# Patient Record
Sex: Female | Born: 1935 | Race: White | Hispanic: No
Health system: Southern US, Community
[De-identification: ages and names within clinical notes are randomized; demographics above are authoritative.]

---

## 2000-02-17 ENCOUNTER — Encounter: Payer: Self-pay | Admitting: *Deleted

## 2000-02-17 ENCOUNTER — Encounter: Admission: RE | Admit: 2000-02-17 | Discharge: 2000-02-17 | Payer: Self-pay | Admitting: *Deleted

## 2000-09-22 ENCOUNTER — Inpatient Hospital Stay (HOSPITAL_COMMUNITY): Admission: AD | Admit: 2000-09-22 | Discharge: 2000-09-29 | Payer: Self-pay | Admitting: Orthopedic Surgery

## 2000-09-24 ENCOUNTER — Encounter: Payer: Self-pay | Admitting: Orthopaedic Surgery

## 2000-09-25 ENCOUNTER — Encounter: Payer: Self-pay | Admitting: Orthopedic Surgery

## 2005-08-18 ENCOUNTER — Ambulatory Visit: Payer: Self-pay | Admitting: Orthopedic Surgery

## 2006-04-25 ENCOUNTER — Encounter: Admission: RE | Admit: 2006-04-25 | Discharge: 2006-04-25 | Payer: Self-pay | Admitting: Unknown Physician Specialty

## 2006-05-08 ENCOUNTER — Encounter: Admission: RE | Admit: 2006-05-08 | Discharge: 2006-05-08 | Payer: Self-pay | Admitting: Unknown Physician Specialty

## 2006-08-30 ENCOUNTER — Encounter: Payer: Self-pay | Admitting: Internal Medicine

## 2008-03-31 ENCOUNTER — Encounter: Payer: Self-pay | Admitting: Cardiology

## 2008-03-31 ENCOUNTER — Ambulatory Visit: Payer: Self-pay | Admitting: Cardiovascular Disease

## 2008-03-31 ENCOUNTER — Inpatient Hospital Stay (HOSPITAL_COMMUNITY): Admission: EM | Admit: 2008-03-31 | Discharge: 2008-04-04 | Payer: Self-pay | Admitting: Cardiology

## 2008-04-14 DIAGNOSIS — D649 Anemia, unspecified: Secondary | ICD-10-CM | POA: Insufficient documentation

## 2008-04-14 DIAGNOSIS — I10 Essential (primary) hypertension: Secondary | ICD-10-CM | POA: Insufficient documentation

## 2008-04-14 DIAGNOSIS — I2510 Atherosclerotic heart disease of native coronary artery without angina pectoris: Secondary | ICD-10-CM | POA: Insufficient documentation

## 2008-04-14 DIAGNOSIS — I252 Old myocardial infarction: Secondary | ICD-10-CM | POA: Insufficient documentation

## 2008-04-14 DIAGNOSIS — M1990 Unspecified osteoarthritis, unspecified site: Secondary | ICD-10-CM | POA: Insufficient documentation

## 2008-04-16 ENCOUNTER — Encounter: Payer: Self-pay | Admitting: Physician Assistant

## 2008-04-16 ENCOUNTER — Ambulatory Visit: Payer: Self-pay | Admitting: Internal Medicine

## 2008-05-15 ENCOUNTER — Telehealth: Payer: Self-pay | Admitting: Cardiology

## 2008-06-02 ENCOUNTER — Telehealth: Payer: Self-pay | Admitting: Cardiology

## 2008-06-13 ENCOUNTER — Ambulatory Visit: Payer: Self-pay | Admitting: Cardiology

## 2008-06-13 DIAGNOSIS — E7800 Pure hypercholesterolemia, unspecified: Secondary | ICD-10-CM

## 2008-06-13 DIAGNOSIS — R0989 Other specified symptoms and signs involving the circulatory and respiratory systems: Secondary | ICD-10-CM

## 2008-06-13 DIAGNOSIS — I739 Peripheral vascular disease, unspecified: Secondary | ICD-10-CM

## 2008-06-16 LAB — CONVERTED CEMR LAB
ALT: 10 units/L (ref 0–35)
Alkaline Phosphatase: 50 units/L (ref 39–117)
BUN: 22 mg/dL (ref 6–23)
Basophils Absolute: 0 10*3/uL (ref 0.0–0.1)
Basophils Relative: 0.2 % (ref 0.0–3.0)
Bilirubin, Direct: 0.1 mg/dL (ref 0.0–0.3)
Cholesterol: 87 mg/dL (ref 0–200)
Creatinine, Ser: 1.7 mg/dL — ABNORMAL HIGH (ref 0.4–1.2)
Eosinophils Absolute: 0.1 10*3/uL (ref 0.0–0.7)
GFR calc non Af Amer: 30.18 mL/min (ref >60)
Glucose, Bld: 106 mg/dL — ABNORMAL HIGH (ref 70–99)
Hemoglobin: 9.7 g/dL — ABNORMAL LOW (ref 12.0–15.0)
Lymphocytes Relative: 17.2 % (ref 12.0–46.0)
MCHC: 33.4 g/dL (ref 30.0–36.0)
MCV: 90.8 fL (ref 78.0–100.0)
Monocytes Absolute: 0.4 10*3/uL (ref 0.1–1.0)
Neutro Abs: 3.1 10*3/uL (ref 1.4–7.7)
Neutrophils Relative %: 71.8 % (ref 43.0–77.0)
RDW: 14.4 % (ref 11.5–14.6)
Total Bilirubin: 0.6 mg/dL (ref 0.3–1.2)
Total Protein: 6.2 g/dL (ref 6.0–8.3)

## 2008-06-25 ENCOUNTER — Ambulatory Visit: Payer: Self-pay | Admitting: Cardiology

## 2008-09-17 ENCOUNTER — Ambulatory Visit: Payer: Self-pay | Admitting: Cardiology

## 2008-09-26 ENCOUNTER — Inpatient Hospital Stay: Admission: AD | Admit: 2008-09-26 | Discharge: 2008-10-11 | Payer: Self-pay | Admitting: Internal Medicine

## 2008-10-11 ENCOUNTER — Ambulatory Visit: Payer: Self-pay | Admitting: Cardiology

## 2008-10-11 ENCOUNTER — Inpatient Hospital Stay (HOSPITAL_COMMUNITY): Admission: EM | Admit: 2008-10-11 | Discharge: 2008-10-17 | Payer: Self-pay | Admitting: Emergency Medicine

## 2008-10-13 ENCOUNTER — Encounter (INDEPENDENT_AMBULATORY_CARE_PROVIDER_SITE_OTHER): Payer: Self-pay | Admitting: Internal Medicine

## 2008-10-13 ENCOUNTER — Encounter: Payer: Self-pay | Admitting: Cardiology

## 2008-11-24 DEATH — deceased

## 2010-02-14 ENCOUNTER — Encounter: Payer: Self-pay | Admitting: Unknown Physician Specialty

## 2010-04-30 LAB — BASIC METABOLIC PANEL
BUN: 39 mg/dL — ABNORMAL HIGH (ref 6–23)
CO2: 31 mEq/L (ref 19–32)
CO2: 32 mEq/L (ref 19–32)
Calcium: 8.3 mg/dL — ABNORMAL LOW (ref 8.4–10.5)
Calcium: 8.5 mg/dL (ref 8.4–10.5)
Chloride: 101 mEq/L (ref 96–112)
Chloride: 96 mEq/L (ref 96–112)
Creatinine, Ser: 1.18 mg/dL (ref 0.4–1.2)
Creatinine, Ser: 1.38 mg/dL — ABNORMAL HIGH (ref 0.4–1.2)
GFR calc Af Amer: 29 mL/min — ABNORMAL LOW (ref >60)
GFR calc Af Amer: 44 mL/min — ABNORMAL LOW (ref >60)
Potassium: 4.1 mEq/L (ref 3.5–5.1)
Sodium: 136 mEq/L (ref 135–145)
Sodium: 136 mEq/L (ref 135–145)

## 2010-04-30 LAB — POCT CARDIAC MARKERS
CKMB, poc: 2.8 ng/mL (ref 1.0–8.0)
Troponin i, poc: <0.05 ng/mL (ref 0.00–0.09)

## 2010-04-30 LAB — DIFFERENTIAL
Basophils Absolute: 0 10*3/uL (ref 0.0–0.1)
Basophils Absolute: 0 10*3/uL (ref 0.0–0.1)
Basophils Relative: 0 % (ref 0–1)
Basophils Relative: 0 % (ref 0–1)
Basophils Relative: 0 % (ref 0–1)
Basophils Relative: 0 % (ref 0–1)
Eosinophils Absolute: 0 10*3/uL (ref 0.0–0.7)
Eosinophils Absolute: 0.1 10*3/uL (ref 0.0–0.7)
Eosinophils Absolute: 0.3 10*3/uL (ref 0.0–0.7)
Eosinophils Relative: 0 % (ref 0–5)
Eosinophils Relative: 2 % (ref 0–5)
Eosinophils Relative: 2 % (ref 0–5)
Eosinophils Relative: 3 % (ref 0–5)
Lymphocytes Relative: 4 % — ABNORMAL LOW (ref 12–46)
Lymphocytes Relative: 8 % — ABNORMAL LOW (ref 12–46)
Lymphs Abs: 0.4 10*3/uL — ABNORMAL LOW (ref 0.7–4.0)
Monocytes Absolute: 0 10*3/uL — ABNORMAL LOW (ref 0.1–1.0)
Monocytes Absolute: 0.3 10*3/uL (ref 0.1–1.0)
Monocytes Absolute: 0.5 10*3/uL (ref 0.1–1.0)
Monocytes Absolute: 0.5 10*3/uL (ref 0.1–1.0)
Monocytes Relative: 1 % — ABNORMAL LOW (ref 3–12)
Monocytes Relative: 5 % (ref 3–12)
Monocytes Relative: 5 % (ref 3–12)
Neutro Abs: 7.6 10*3/uL (ref 1.7–7.7)
Neutrophils Relative %: 86 % — ABNORMAL HIGH (ref 43–77)

## 2010-04-30 LAB — COMPREHENSIVE METABOLIC PANEL
ALT: 14 U/L (ref 0–35)
AST: 19 U/L (ref 0–37)
AST: 23 U/L (ref 0–37)
AST: 26 U/L (ref 0–37)
Albumin: 2.8 g/dL — ABNORMAL LOW (ref 3.5–5.2)
Albumin: 2.8 g/dL — ABNORMAL LOW (ref 3.5–5.2)
Albumin: 2.9 g/dL — ABNORMAL LOW (ref 3.5–5.2)
Alkaline Phosphatase: 69 U/L (ref 39–117)
Alkaline Phosphatase: 77 U/L (ref 39–117)
Calcium: 8.2 mg/dL — ABNORMAL LOW (ref 8.4–10.5)
Chloride: 96 mEq/L (ref 96–112)
Chloride: 99 mEq/L (ref 96–112)
Creatinine, Ser: 1.5 mg/dL — ABNORMAL HIGH (ref 0.4–1.2)
GFR calc Af Amer: 32 mL/min — ABNORMAL LOW (ref >60)
GFR calc Af Amer: 40 mL/min — ABNORMAL LOW (ref >60)
GFR calc Af Amer: 45 mL/min — ABNORMAL LOW (ref >60)
GFR calc non Af Amer: 33 mL/min — ABNORMAL LOW (ref >60)
Potassium: 4.3 mEq/L (ref 3.5–5.1)
Potassium: 5 mEq/L (ref 3.5–5.1)
Sodium: 137 mEq/L (ref 135–145)
Sodium: 138 mEq/L (ref 135–145)
Total Bilirubin: 0.6 mg/dL (ref 0.3–1.2)
Total Bilirubin: 1.2 mg/dL (ref 0.3–1.2)
Total Protein: 5.6 g/dL — ABNORMAL LOW (ref 6.0–8.3)
Total Protein: 5.8 g/dL — ABNORMAL LOW (ref 6.0–8.3)

## 2010-04-30 LAB — URINE MICROSCOPIC-ADD ON

## 2010-04-30 LAB — CARDIAC PANEL(CRET KIN+CKTOT+MB+TROPI)
CK, MB: 6.2 ng/mL — ABNORMAL HIGH (ref 0.3–4.0)
CK, MB: 7.6 ng/mL — ABNORMAL HIGH (ref 0.3–4.0)
Relative Index: 7.7 — ABNORMAL HIGH (ref 0.0–2.5)
Relative Index: INVALID (ref 0.0–2.5)
Relative Index: INVALID (ref 0.0–2.5)
Total CK: 85 U/L (ref 7–177)
Troponin I: 0.18 ng/mL — ABNORMAL HIGH (ref 0.00–0.06)
Troponin I: 0.21 ng/mL — ABNORMAL HIGH (ref 0.00–0.06)
Troponin I: 0.24 ng/mL — ABNORMAL HIGH (ref 0.00–0.06)

## 2010-04-30 LAB — URINALYSIS, ROUTINE W REFLEX MICROSCOPIC
Bilirubin Urine: NEGATIVE
Nitrite: POSITIVE — AB
Specific Gravity, Urine: 1.01 (ref 1.005–1.030)
Urobilinogen, UA: 0.2 mg/dL (ref 0.0–1.0)

## 2010-04-30 LAB — PROTIME-INR
INR: 2.7 — ABNORMAL HIGH (ref 0.00–1.49)
INR: 2.9 — ABNORMAL HIGH (ref 0.00–1.49)
INR: 4.5 — ABNORMAL HIGH (ref 0.00–1.49)
Prothrombin Time: 28.8 seconds — ABNORMAL HIGH (ref 11.6–15.2)
Prothrombin Time: 30.1 seconds — ABNORMAL HIGH (ref 11.6–15.2)
Prothrombin Time: 41.3 seconds — ABNORMAL HIGH (ref 11.6–15.2)

## 2010-04-30 LAB — URINE CULTURE

## 2010-04-30 LAB — CBC
HCT: 28.1 % — ABNORMAL LOW (ref 36.0–46.0)
Hemoglobin: 8.5 g/dL — ABNORMAL LOW (ref 12.0–15.0)
MCHC: 33.3 g/dL (ref 30.0–36.0)
MCHC: 33.6 g/dL (ref 30.0–36.0)
MCHC: 34 g/dL (ref 30.0–36.0)
MCV: 90 fL (ref 78.0–100.0)
MCV: 90.1 fL (ref 78.0–100.0)
MCV: 90.6 fL (ref 78.0–100.0)
Platelets: 262 10*3/uL (ref 150–400)
Platelets: 272 10*3/uL (ref 150–400)
Platelets: 295 10*3/uL (ref 150–400)
Platelets: 302 10*3/uL (ref 150–400)
RBC: 2.82 MIL/uL — ABNORMAL LOW (ref 3.87–5.11)
RBC: 3.12 MIL/uL — ABNORMAL LOW (ref 3.87–5.11)
RBC: 3.36 MIL/uL — ABNORMAL LOW (ref 3.87–5.11)
RDW: 14.4 % (ref 11.5–15.5)
RDW: 14.5 % (ref 11.5–15.5)
WBC: 4.4 10*3/uL (ref 4.0–10.5)
WBC: 8.7 10*3/uL (ref 4.0–10.5)
WBC: 8.8 10*3/uL (ref 4.0–10.5)

## 2010-04-30 LAB — CROSSMATCH

## 2010-04-30 LAB — BLOOD GAS, ARTERIAL
Acid-Base Excess: 1.3 mmol/L (ref 0.0–2.0)
Bicarbonate: 26 mEq/L — ABNORMAL HIGH (ref 20.0–24.0)
TCO2: 24.7 mmol/L (ref 0–100)
pCO2 arterial: 45.7 mmHg — ABNORMAL HIGH (ref 35.0–45.0)
pO2, Arterial: 92.2 mmHg (ref 80.0–100.0)

## 2010-04-30 LAB — MAGNESIUM: Magnesium: 1.7 mg/dL (ref 1.5–2.5)

## 2010-04-30 LAB — ABO/RH: ABO/RH(D): AB POS

## 2010-05-06 LAB — CBC
HCT: 29.7 % — ABNORMAL LOW (ref 36.0–46.0)
Hemoglobin: 10.1 g/dL — ABNORMAL LOW (ref 12.0–15.0)
MCHC: 34.3 g/dL (ref 30.0–36.0)
MCV: 88.8 fL (ref 78.0–100.0)
MCV: 88.8 fL (ref 78.0–100.0)
MCV: 90 fL (ref 78.0–100.0)
Platelets: 167 10*3/uL (ref 150–400)
Platelets: 182 10*3/uL (ref 150–400)
Platelets: 230 10*3/uL (ref 150–400)
RBC: 2.74 MIL/uL — ABNORMAL LOW (ref 3.87–5.11)
RDW: 14.3 % (ref 11.5–15.5)
WBC: 5.1 10*3/uL (ref 4.0–10.5)
WBC: 5.4 10*3/uL (ref 4.0–10.5)
WBC: 5.8 10*3/uL (ref 4.0–10.5)

## 2010-05-06 LAB — BASIC METABOLIC PANEL
BUN: 18 mg/dL (ref 6–23)
BUN: 29 mg/dL — ABNORMAL HIGH (ref 6–23)
Calcium: 8.3 mg/dL — ABNORMAL LOW (ref 8.4–10.5)
Chloride: 104 mEq/L (ref 96–112)
Chloride: 108 mEq/L (ref 96–112)
Creatinine, Ser: 1.16 mg/dL (ref 0.4–1.2)
Creatinine, Ser: 1.3 mg/dL — ABNORMAL HIGH (ref 0.4–1.2)
GFR calc Af Amer: 47 mL/min — ABNORMAL LOW (ref >60)
GFR calc non Af Amer: 44 mL/min — ABNORMAL LOW (ref >60)
Glucose, Bld: 96 mg/dL (ref 70–99)
Potassium: 4.1 mEq/L (ref 3.5–5.1)

## 2010-05-06 LAB — DIFFERENTIAL
Basophils Absolute: 0 10*3/uL (ref 0.0–0.1)
Basophils Relative: 0 % (ref 0–1)
Eosinophils Absolute: 0.1 10*3/uL (ref 0.0–0.7)
Monocytes Absolute: 0.4 10*3/uL (ref 0.1–1.0)
Neutro Abs: 4.3 10*3/uL (ref 1.7–7.7)

## 2010-05-06 LAB — CROSSMATCH: Antibody Screen: NEGATIVE

## 2010-05-06 LAB — APTT: aPTT: 95 seconds — ABNORMAL HIGH (ref 24–37)

## 2010-05-06 LAB — PROTIME-INR
INR: 1 (ref 0.00–1.49)
Prothrombin Time: 13.2 seconds (ref 11.6–15.2)

## 2010-05-06 LAB — COMPREHENSIVE METABOLIC PANEL
Albumin: 3.1 g/dL — ABNORMAL LOW (ref 3.5–5.2)
Alkaline Phosphatase: 71 U/L (ref 39–117)
BUN: 17 mg/dL (ref 6–23)
Chloride: 103 mEq/L (ref 96–112)
Glucose, Bld: 100 mg/dL — ABNORMAL HIGH (ref 70–99)
Potassium: 3.9 mEq/L (ref 3.5–5.1)
Total Bilirubin: 0.6 mg/dL (ref 0.3–1.2)

## 2010-05-06 LAB — CARDIAC PANEL(CRET KIN+CKTOT+MB+TROPI)
CK, MB: 4.5 ng/mL — ABNORMAL HIGH (ref 0.3–4.0)
Relative Index: INVALID (ref 0.0–2.5)
Relative Index: INVALID (ref 0.0–2.5)
Troponin I: 2.09 ng/mL (ref 0.00–0.06)

## 2010-05-06 LAB — HEPARIN LEVEL (UNFRACTIONATED)
Heparin Unfractionated: 0.14 IU/mL — ABNORMAL LOW (ref 0.30–0.70)
Heparin Unfractionated: 0.44 IU/mL (ref 0.30–0.70)
Heparin Unfractionated: 0.68 IU/mL (ref 0.30–0.70)
Heparin Unfractionated: 0.78 IU/mL — ABNORMAL HIGH (ref 0.30–0.70)

## 2010-05-06 LAB — LIPID PANEL
LDL Cholesterol: 76 mg/dL (ref 0–99)
Total CHOL/HDL Ratio: 2.8 RATIO
VLDL: 11 mg/dL (ref 0–40)

## 2010-05-06 LAB — ABO/RH: ABO/RH(D): AB POS

## 2010-05-06 LAB — MAGNESIUM: Magnesium: 1.9 mg/dL (ref 1.5–2.5)

## 2010-05-06 LAB — FERRITIN: Ferritin: 19 ng/mL (ref 10–291)

## 2010-05-06 LAB — FOLATE: Folate: >20 ng/mL

## 2010-05-06 LAB — VITAMIN B12: Vitamin B-12: 235 pg/mL (ref 211–911)

## 2010-05-06 LAB — HEMOGLOBIN A1C: Mean Plasma Glucose: 103 mg/dL

## 2010-06-08 NOTE — Cardiovascular Report (Signed)
NAMECATHERENE, [name]                ACCOUNT NO.:  [id number]   MEDICAL RECORD NO.:  [id number]          PATIENT TYPE:  INP   LOCATION:  2908                         FACILITY:  [facility]   PHYSICIAN:  Bevelyn Buckles. Bensimhon, MDDATE OF BIRTH:  1919-01-05   DATE OF PROCEDURE:  03/31/2008  DATE OF DISCHARGE:                            CARDIAC CATHETERIZATION   INDICATION:  Ms. [name] is a delightful 77 year old woman with a history  of hypertension, who was admitted with a non-ST elevation myocardial  infarction.  She was brought to the cardiac catheterization lab for  diagnostic angiography.   PROCEDURES PERFORMED:  1. Selective coronary angiography.  2. Left heart cath.  3. Left ventriculogram.  4. Abdominal aortogram.   DESCRIPTION OF PROCEDURE:  Risks and indication of the catheterization  were explained.  Consent was signed and placed on the chart.  Initially  we placed a 5-French arterial sheath in the right femoral artery.  However, we found that the distal aorta was totally occluded.  We thus  turned our attention to the right brachial site.  This was prepped and  draped in routine sterile fashion.  A 5-French arterial sheath was  placed without difficulty.  She was anticoagulated with systemic  heparin.   Standard catheters including JL-4, JR-4, and angled pigtail used for  procedure.  All catheter exchanges made over wire.  There are no  apparent complications.  Central aortic pressure is 135/56 with mean of  91.  LV pressure is 158/7 with EDP of 11.  There was no aortic stenosis.   Left main had some mild-to-moderate ostial disease.  It was hard to  quantify this, did not appear to be high-grade stenosis.   LAD was moderate-to-large size vessel.  It gave off a large diagonal  branch, a large septal perforator.  It was heavily calcified.  In the  proximal portion, there was a very complex calcified plaque  approximately 95% in severity after takeoff of the diagonal branch and  large septal perforator.  More distally there was a 40% tubular lesion  in the LAD.  In the proximal part of the septal perforator, there was a  70% stenosis.   Left circumflex was an extremely tortuous vessel.  It was heavily  calcified.  It gave off a small ramus branch and two marginal branches.  In the mid AV groove circ, there was 90-95% lesion followed by another  50% lesion.  Both were round tight bands.   Right coronary artery was a large dominant vessel.  It gave off a PDA  and several small posterolaterals.  There was heavily calcified plaque  in the proximal portion of the RCA, which had the appearance of a  ruptured plaque.  This was a 99% stenosis.  Through the proximal  midsection, it was heavily calcified about 40% throughout.  In the mid  distal RCA, there was 50-60% focal lesion.  There was a question of the  appearance of competitive flow in the distal PDA.   Left ventriculogram done by hand injection showed an EF what appeared to  be about 65% though the  ventricle was not well opacified.   Abdominal aorta was totally occluded just above the iliacs.   ASSESSMENT:  1. Severe three-vessel calcified coronary artery disease.  2. Apparently normal function.  3. Severe peripheral arterial disease with totally occluded distal      aorta.   PLAN/DISCUSSION:  This is a very difficult situation and I suspect the  culprit lesion is likely the proximal RCA.  The proximal LAD is also a  very tight.  Both lesions are not easily amenable to percutaneous  intervention.  Unfortunately she is also not a good candidate for bypass  surgery.  I have reviewed the films with Dr. Riley Kill and we will take  her off the table and we will review the situation with the remainder of  the interventional team as well as her family.      Bevelyn Buckles. Bensimhon, MD  Electronically Signed     DRB/MEDQ  D:  03/31/2008  T:  04/01/2008  Job:  [id number]

## 2010-06-08 NOTE — Discharge Summary (Signed)
[name], [name]                ACCOUNT NO.:  [id number]   MEDICAL RECORD NO.:  [id number]          PATIENT TYPE:  INP   LOCATION:  2908                         FACILITY:  [facility]   PHYSICIAN:  Noralyn Pick. Eden Emms, MD, FACCDATE OF BIRTH:  [birth year]   DATE OF ADMISSION:  03/31/2008  DATE OF DISCHARGE:  04/04/2008                               DISCHARGE SUMMARY   ADDENDUM   This is an addendum to the discharge summary completed on April 04, 2008, report number [id number], regarding followup appointments.  The patient  will have a complete metabolic panel and complete blood count completed  at the [name] in [place].  The patient will be instructed and given  a prescription for these tests to take place on of before April 14, 2008, and [name] will fax results to the [name].  The  patient will also be given the phone number to the [name] to call  with questions regarding setting up this lab draw.  The patient will  have a followup appointment with [name], in lieu of Dr.  Jens Howe, secondary to [name] not being available within the  preferred range of time for her first appointment.  The patient will be  seen on April 16, 2008, at 1:15 p.m.  Note, the patient has agreed that  driving down to [facility] will not be a problem in the future for her  Cardiology followup appointment.      Jarrett Ables, PAC      Heather C. Eden Emms, MD, [facility]  Electronically Signed    MS/MEDQ  D:  04/04/2008  T:  04/04/2008  Job:  [id number]

## 2010-06-08 NOTE — Discharge Summary (Signed)
NAMEJAYLINE, KILBURG                ACCOUNT NO.:  [id number]   MEDICAL RECORD NO.:  [id number]          PATIENT TYPE:  INP   LOCATION:  2908                         FACILITY:  [facility]   PHYSICIAN:  Noralyn Pick. Eden Emms, MD, FACCDATE OF BIRTH:  August 29, 1918   DATE OF ADMISSION:  03/31/2008  DATE OF DISCHARGE:  04/04/2008                               DISCHARGE SUMMARY   PRIMARY CARDIOLOGIST:  Madolyn Frieze. Jens Som, MD, [facility]   PRIMARY CARE PHYSICIAN:  Kirstie Peri, [place], [place], [place].   DISCHARGE DIAGNOSES:  1. Non-ST-elevation myocardial infarction.  2. Coronary artery disease (severe three-calcified coronary artery      disease).  3. Normocytic anemia.   SECONDARY DIAGNOSES:  1. Hypertension.  2. Osteoarthritis.   ALLERGIES:  NKDA.   PROCEDURE PERFORMED DURING THIS HOSPITALIZATION:  1. EKG performed on March 31, 2008, that showed in sinus rhythm with      premature atrial complexes, minimal T-wave flattening in V4, T-wave      flattening in V5, V6, and lead II, and then T-wave inversion in      lead III and aVF.  No significant Q-waves.  Normal axis.  No      evidence of hypertrophy.  No prior EKG for comparison.  The patient      had chest x-ray on March 31, 2008, that showed cardiomegaly without      evidence of pulmonary edema.  The patient had a 2-D echocardiogram      on March 31, 2008, that showed all LVEF approximately 50% with      akinesis of the basal inferior wall; hypokinesis of the mid      inferior and basal posterior walls.  There was mild mitral valvular      regurgitation.  There was mild to moderate pulmonic regurgitation.      The patient had cardiac catheterization on March 31, 2008, that      showed:      a.     Severe three-vessel calcified coronary artery disease.      b.     Apparently normal function.      c.     Severe peripheral arterial disease with totally occluded       distal aorta.   HISTORY OF PRESENT ILLNESS:  This is an 77 year old Caucasian  female  with known history of coronary artery disease.  She was awakened with  severe 10/10 chest pressure 3 mornings ago (March 28, 2008).  This lasted  several hours.  She had never experienced these symptoms before.  She  was extremely uncomfortable, short of breath, diaphoretic, finally  called 911.  By the time EMS arrived, she was feeling and so she  declined to go to the emergency room.  However, she then had  intermittent chest pressure over the next 2 days and finally on date of  admission had persistent chest pressure/pain across the scapula and the  shoulders and shortness of breath prompting her to present to the local  emergency department for evaluation.  There, she had an abnormal EKG and  her troponin was  greater than 1.  She was thus transferred to [facility].   HOSPITAL COURSE:  So, the patient was admitted to [facility]  and given her elevated cardiac enzymes as well as an abnormal  echocardiogram and EKG the patient underwent diagnostic cardiac  catheterization (see results above).  The patient tolerated the  procedure well without significant complications.  The patient remained  asymptomatic after her cardiac catheterization except for a brief period  of right groin tenderness, but resolved as of April 02, 2008.  Due to  the high risk of PCI with given the nature of her disease, all the  interventionists at [facility] discussed the plan moving forward  and on April 02, 2008, it was decided that the patient would be best  managed medically and high risk PCI only considering in the future  should her symptoms return/considerably worsen.  Note, when the patient  first evaluated at [facility], she had normocytic anemia just over 10  and post cath she dropped down to 8.3 prompting transfusion of 2 units  of packed red blood cells.  The patient's hemoglobin got back up to 10.2  and she was started on iron sulfate 325 mg p.o. b.i.d. The patient  was  deemed stable enough for discharge on April 04, 2008, after ambulating  day before without significant symptoms and remaining asymptomatic.  Her  vital signs remained stable. The patient had a brief run of premature  atrial complexes up to a rate of 126 ventricular responses per minute  lasting approximately 6 seconds on the morning of April 04, 2008, end of  run viewed on tele without flutter waves, right back into sinus.   MOST RECENT VITAL SIGNS ON DAY OF DISCHARGE:  Temperature 99.5 degrees  Fahrenheit, BP 129/44, heart rate 73 beats per minute, respiration rate  19, and O2 saturation 95% on room air.   LABORATORY DATA:  WBC 5.4, HGB 10.3, HCT 30.2, and PLT count 167.  Sodium 139, potassium 3.9, chloride 104, CO2 of 25, BUN 18, creatinine  1.16, glucose 96, and calcium 8.4.  Vitamin B12 of 235, folate serum  greater than 20.0, ferritin 19, and magnesium 1.9.  Hemoglobin A1c 5.2,  total cholesterol 135, triglycerides 54, HDL 48, LDL 76, and VLDL of 11.  TSH 3.002.  Total bilirubin 0.6, alkaline phosphatase 71, AST 26, ALT  11, total protein 5.9, and albumin 3.1.   FOLLOWUP PLANS AND APPOINTMENTS:  The patient lives in [place] and was  mistakenly scheduled to be seen in [place].  Please see addendum for  change to followup appointments with [facility].   DISCHARGE MEDICATIONS:  1. Labetalol 200 mg p.o. b.i.d.  2. Crestor 40 mg p.o. daily.  3. Hydrochlorothiazide 25 mg p.o. daily.  4. Norvasc 10 mg p.o. daily.  5. Protonix 40 mg p.o. daily.  6. Plavix 75 mg p.o. daily.  7. Aspirin 81 mg p.o. daily.  8. Ferrous sulfate 325 mg p.o. b.i.d.  9. Imdur 90 mg (3 times 30-mg tabs) p.o. daily.  10.Gabapentin 300 mg p.o. t.i.d.  11.Nitroglycerin 0.4 mg sublingually p.r.n. for chest pain.   Duration of discharge encounter including physician time was 45 minutes.      Jarrett Ables, PAC      Peter C. Eden Emms, MD, [facility]  Electronically Signed    MS/MEDQ  D:   04/04/2008  T:  04/04/2008  Job:  [id number]   cc:   Madolyn Frieze. Jens Som, MD, [facility]  Kirstie Peri,  MD

## 2010-06-08 NOTE — H&P (Signed)
NAMEMADIE, CAHN NO.:  [id number]   MEDICAL RECORD NO.:  [id number]          PATIENT TYPE:  INP   LOCATION:  2908                         FACILITY:  [facility]   PHYSICIAN:  Christell Faith, MD   DATE OF BIRTH:  Jun 30, 1918   DATE OF ADMISSION:  03/31/2008  DATE OF DISCHARGE:                              HISTORY & PHYSICAL   PRIMARY CARE PHYSICIAN:  Kirstie Peri, [place] in [place], [place].   CHIEF COMPLAINT:  Chest pain.   HISTORY OF PRESENT ILLNESS:  This is an 77 year old white female with no  known history of coronary artery disease.  She was awakened with severe  10/10 chest pressure 3 mornings ago (Friday morning).  This lasted  several hours.  She had never had pain like it before.  She felt  extremely uncomfortable, short of breath, diaphoretic, finally called  911.  However, by the time they arrived, she was feeling better and so  she declined to go to the emergency room.  However, she then had  intermittent chest pressure over the next 2 days and finally today had  persistent chest pressure pain across the scapula and shoulders and  shortness of breath and so went to the local emergency room where her  EKG was abnormal, and her troponin was greater than 1.  She was thus  transferred to the [facility] Coronary Care Unit.   PAST MEDICAL HISTORY:  1. Hypertension.  2. Osteoarthritis, status post bilateral total knee replacement.   SOCIAL HISTORY:  Lives in [place] with her son.  She is a widow.  Lifelong  nonsmoker, nondrinker.   FAMILY HISTORY:  Mother and father both deceased, neither had coronary  disease.  She lives with her son who has what sounds like metastatic  cancer and is undergoing treatment at [facility].   REVIEW OF SYSTEMS:  Positive for chest pain, shortness of breath, joint  pain over the past 2-3 days and also a pain across her back and scapula  for the past several weeks which has been diagnosed as arthritis.  The  balance of 14 systems is  reviewed and negative.   ALLERGIES:  None.   MEDICATIONS:  1. HCTZ 25 mg p.o. daily.  2. Norvasc 10 mg p.o. daily.  3. Hydralazine 50 mg p.o. t.i.d..  4. Aspirin 81 mg p.o. daily.  5. Vicodin q.6 h. as needed.  6. Neurontin 300 mg p.o. t.i.d.  7. Labetalol 200 mg p.o. b.i.d.   PHYSICAL EXAMINATION:  VITAL SIGNS:  Temperature 97.8, pulse 79,  respiratory rate 16, blood pressure 164/70, saturation 99% on 2 L.  GENERAL:  This is a pleasant elderly white female, in no distress.  HEENT:  Pupils are round and reactive.  Sclerae are clear.  Mucous  membranes are moist.  No oral lesions.  She has upper and lower  dentures.  NECK:  Supple.  Neck veins are flat.  No carotid bruits.  LUNGS:  Clear to auscultation bilaterally without wheezing or rales.  CARDIAC:  Normal rate, regular rhythm with frequent PACs and a split  second heart sound.  No murmurs.  ABDOMEN:  Soft, nontender, nondistended.  No bruits.  EXTREMITIES:  No clubbing, cyanosis, or edema.  2+ dorsalis pedis and  radial pulses bilaterally.  NEUROLOGIC:  5/5 strength in all 4 extremities.  Facial expressions are  symmetric and normal.   DIAGNOSTIC TESTS:  EKG shows sinus rhythm 80 beats a minute with  frequent PACs.  Positive for ischemia with inferolateral ST and T-wave  changes.   LABORATORY DATA:  White blood cells 6.7, hemoglobin 10, platelets 247.  Sodium 135, potassium 4, BUN 21, creatinine 1.2, glucose 131, CK 71, CK-  MB 4.9, troponin 1.95.   IMPRESSION:  This is an 78 year old white female with a history of  hypertension and now stuttering pain for the past 3 days with an  abnormal EKG and a positive troponin.   PLAN:  1. Admit to the coronary care unit.  Continue cycle cardiac enzymes      and EKGs.  Continue aspirin and the unfractionated heparin drip      that was initiated at the outside emergency room.  Continue the      nitroglycerin drip to maintain pain-free status.  She is currently      pain free.   We will keep her n.p.o. for possible catheterization in      the morning.  2. Check fasting lipid panel and initiate empiric statin therapy.  3. Continue labetalol and titrate for heart rate and blood pressure      control.  4. We will check a transthoracic echocardiogram to evaluate function.  5. We will continue her home blood pressure medicines given that she      requires multiple blood pressure medicines, is still poorly      controlled.  We will consider renal angiogram or renal artery      duplex scan.  6. Rule out diabetes.  We will check hemoglobin A1c and fasting sugar.      Christell Faith, MD  Electronically Signed     NDL/MEDQ  D:  03/31/2008  T:  03/31/2008  Job:  [id number]

## 2011-06-23 IMAGING — CT CT ANGIO CHEST
1 of 6 series · 5 of 36 positions shown · IV contrast (Omnipaque 300)
Comparison: Chest radiograph 10/11/2008

CLINICAL DATA: Shortness of breath, concern for pulmonary embolism

CT ANGIOGRAPHY CHEST WITH CONTRAST
TECHNIQUE: Multidetector CT imaging of the chest was performed
using the standard protocol during bolus administration of
intravenous contrast. Multiplanar CT image reconstructions
including MIPs were obtained to evaluate the vascular anatomy.
Contrast: 80 ml Omnipaque 300

[Series 4: pe 3.0 b40f · axial · 0.59mm/px · z∈[-281,-110]mm · 5 of 87 slices shown]
[im 15/87  lung]
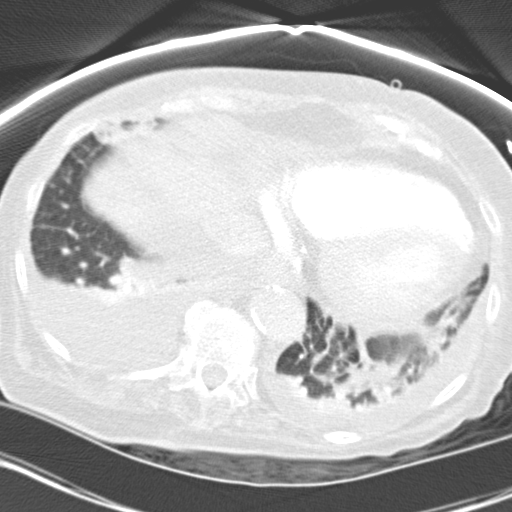
[im 29/87  mediastinal]
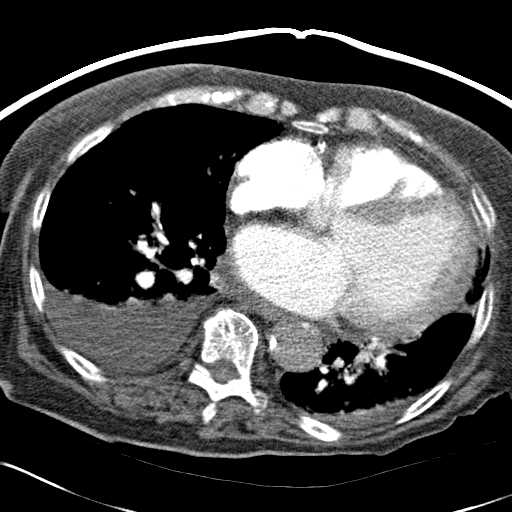
[im 44/87  lung]
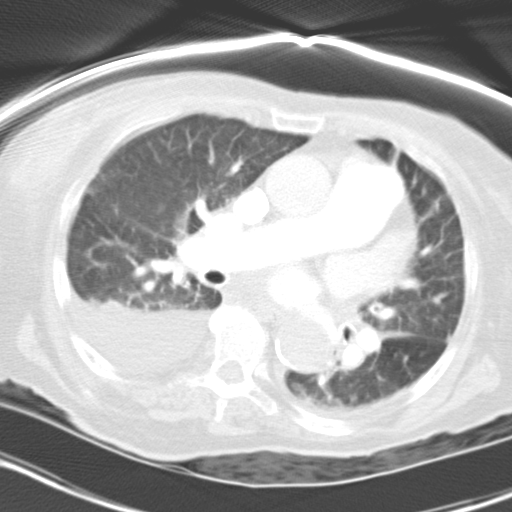
[im 58/87  mediastinal]
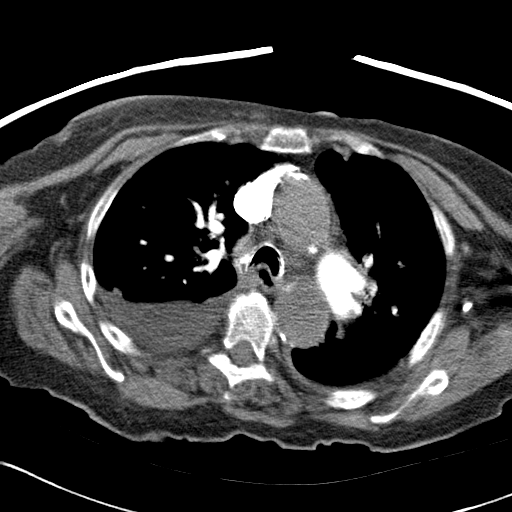
[im 72/87  lung]
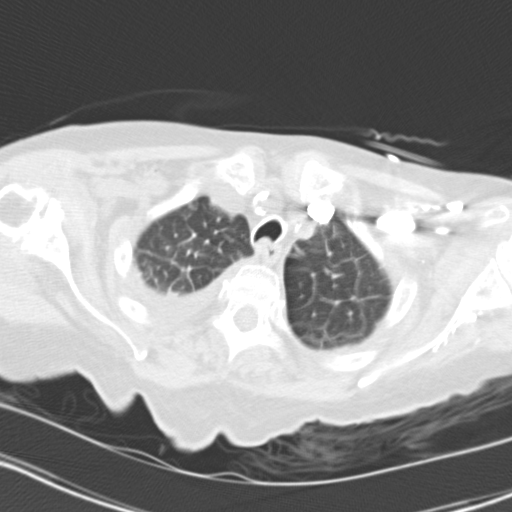

[5 of 36 positions shown; findings below may reference images not displayed]

FINDINGS: Study is technically adequate.  No focal filling defects
within the pulmonary arteries to suggest acute pulmonary embolism.
No acute findings of the aorta or great vessels.  Extensive
coronary artery calcifications.  No pericardial effusion.  Left
ventricle is enlarged.

There are bilateral pleural effusions, right greater than left.
There is interstitial edema evident.  No pneumothorax.  Airways
appear normal.

Limited view of the upper abdomen demonstrates a large left
suprarenal cystic lesion which likely represents a renal cyst
although is incompletely imaged.  No aggressive osseous lesions.

Review of the MIP images confirms the above findings.
IMPRESSION: 1.  No evidence of acute pulmonary embolism.
2.  Pleural effusions, interstitial edema and cardiomegaly suggest
congestive heart failure.

3.  Likely left renal cyst.

## 2011-06-25 IMAGING — CR DG CHEST 1V PORT
1 series · 1 of 1 positions shown · non-contrast
Comparison: Chest CT and chest x-ray 10/12/2008

CLINICAL DATA: Pleural effusion, respiratory distress, chest pain.

PORTABLE CHEST - 1 VIEW

[view not recorded]
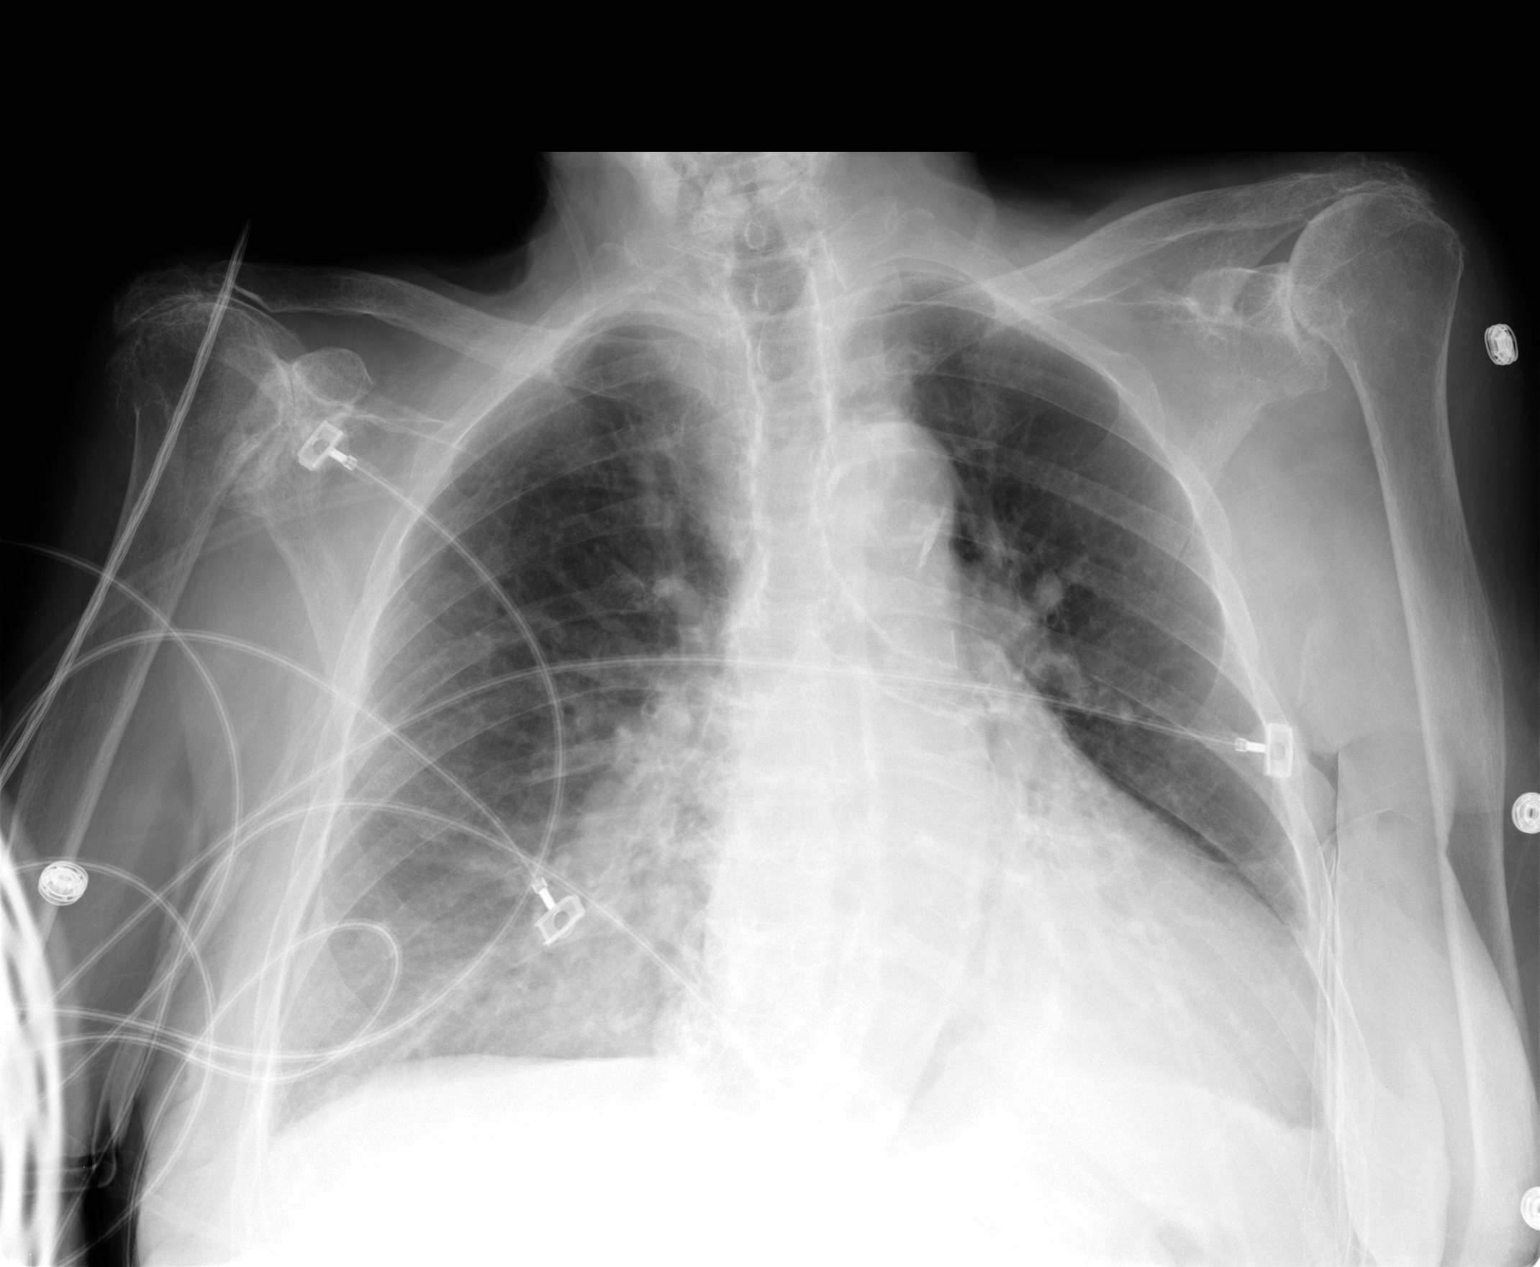

[1 of 1 positions shown; findings below may reference images not displayed]

FINDINGS: Trachea is midline.  Heart is enlarged.  There has been
interval improvement in pulmonary edema.  Probable trace residual
bilateral pleural effusions.  Bibasilar atelectasis.  Degenerative
changes are seen in the shoulders, with high-riding humeral heads.
IMPRESSION: Improving congestive heart failure.

## 2012-05-02 ENCOUNTER — Telehealth: Payer: Self-pay | Admitting: Endocrinology

## 2013-05-06 NOTE — Telephone Encounter (Signed)
error
